# Patient Record
Sex: Female | Born: 1950 | Race: White | Hispanic: No | Marital: Married
Health system: Southern US, Community
[De-identification: ages and names within clinical notes are randomized; demographics above are authoritative.]

---

## 1998-06-06 ENCOUNTER — Other Ambulatory Visit: Admission: RE | Admit: 1998-06-06 | Discharge: 1998-06-06 | Payer: Self-pay | Admitting: Obstetrics and Gynecology

## 2000-09-25 ENCOUNTER — Other Ambulatory Visit: Admission: RE | Admit: 2000-09-25 | Discharge: 2000-09-25 | Payer: Self-pay | Admitting: Family Medicine

## 2005-01-24 ENCOUNTER — Ambulatory Visit: Payer: Self-pay | Admitting: Urology

## 2005-06-25 ENCOUNTER — Ambulatory Visit: Payer: Self-pay | Admitting: Family Medicine

## 2005-07-17 ENCOUNTER — Ambulatory Visit: Payer: Self-pay | Admitting: Family Medicine

## 2005-09-03 ENCOUNTER — Encounter: Payer: Self-pay | Admitting: Family Medicine

## 2005-09-03 ENCOUNTER — Ambulatory Visit: Payer: Self-pay | Admitting: Family Medicine

## 2005-09-03 ENCOUNTER — Other Ambulatory Visit: Admission: RE | Admit: 2005-09-03 | Discharge: 2005-09-03 | Payer: Self-pay | Admitting: Family Medicine

## 2005-09-03 LAB — CONVERTED CEMR LAB: Pap Smear: NORMAL

## 2005-09-18 ENCOUNTER — Ambulatory Visit: Payer: Self-pay | Admitting: Urology

## 2005-09-25 ENCOUNTER — Ambulatory Visit: Payer: Self-pay | Admitting: Urology

## 2005-10-28 ENCOUNTER — Ambulatory Visit: Payer: Self-pay | Admitting: Internal Medicine

## 2005-10-30 ENCOUNTER — Ambulatory Visit: Payer: Self-pay | Admitting: Family Medicine

## 2005-11-04 ENCOUNTER — Ambulatory Visit: Payer: Self-pay | Admitting: Family Medicine

## 2005-11-08 ENCOUNTER — Ambulatory Visit: Payer: Self-pay | Admitting: Internal Medicine

## 2005-11-21 ENCOUNTER — Ambulatory Visit: Payer: Self-pay | Admitting: Family Medicine

## 2006-07-18 ENCOUNTER — Ambulatory Visit: Payer: Self-pay | Admitting: Family Medicine

## 2006-07-18 LAB — CONVERTED CEMR LAB
ALT: 26 units/L (ref 0–40)
BUN: 26 mg/dL — ABNORMAL HIGH (ref 6–23)
CO2: 25 meq/L (ref 19–32)
Cholesterol: 160 mg/dL (ref 0–200)
Creatinine, Ser: 0.9 mg/dL (ref 0.4–1.2)
Glucose, Bld: 102 mg/dL — ABNORMAL HIGH (ref 70–99)
HDL: 37.8 mg/dL — ABNORMAL LOW (ref >39.0)
LDL Cholesterol: 97 mg/dL (ref 0–99)
Phosphorus: 3.3 mg/dL (ref 2.3–4.6)
Total CHOL/HDL Ratio: 4.2
Triglycerides: 124 mg/dL (ref 0–149)

## 2006-07-22 ENCOUNTER — Ambulatory Visit: Payer: Self-pay | Admitting: Family Medicine

## 2006-12-29 IMAGING — CR DG ABDOMEN 1V
1 series · 2 of 2 positions shown · non-contrast
Comparison: none

REASON FOR EXAM: Nephrolithiasis
COMMENTS:

[Series 1: view not recorded · 0.17mm/px · 2 of 2 slices shown]
[im 1/2]
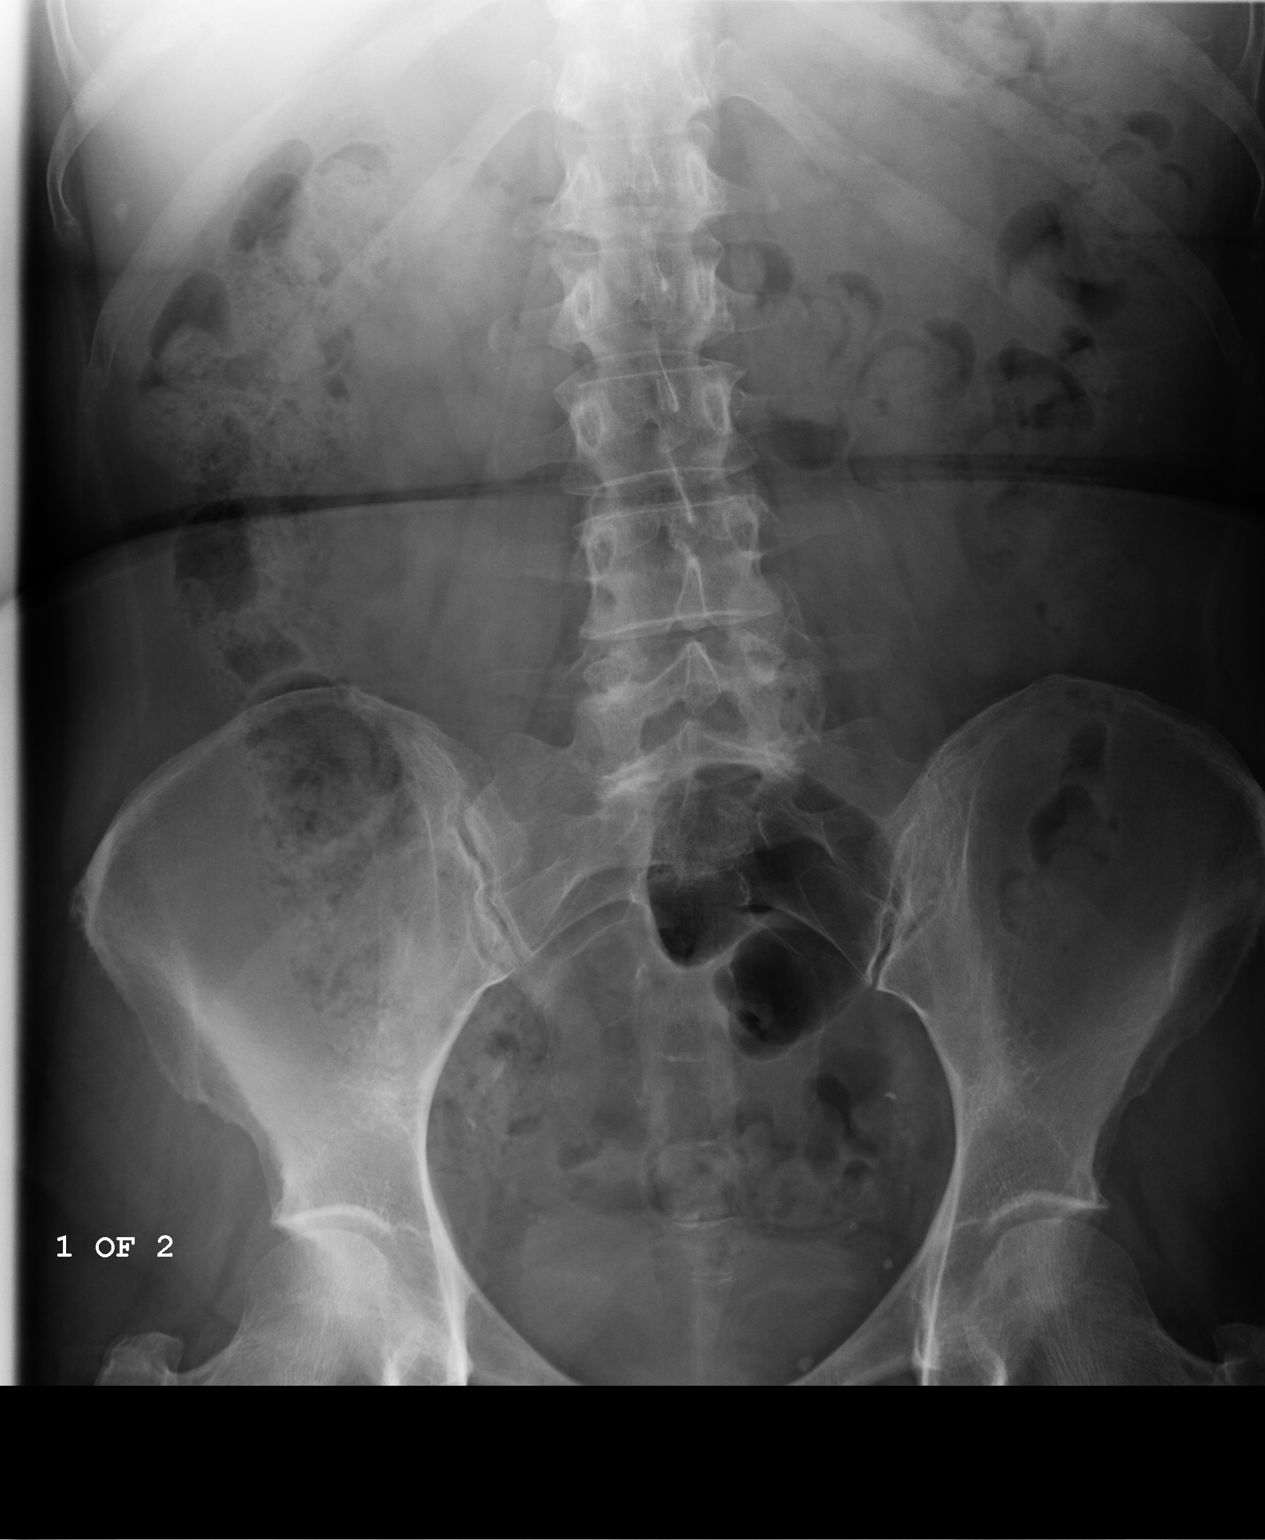
[im 2/2]
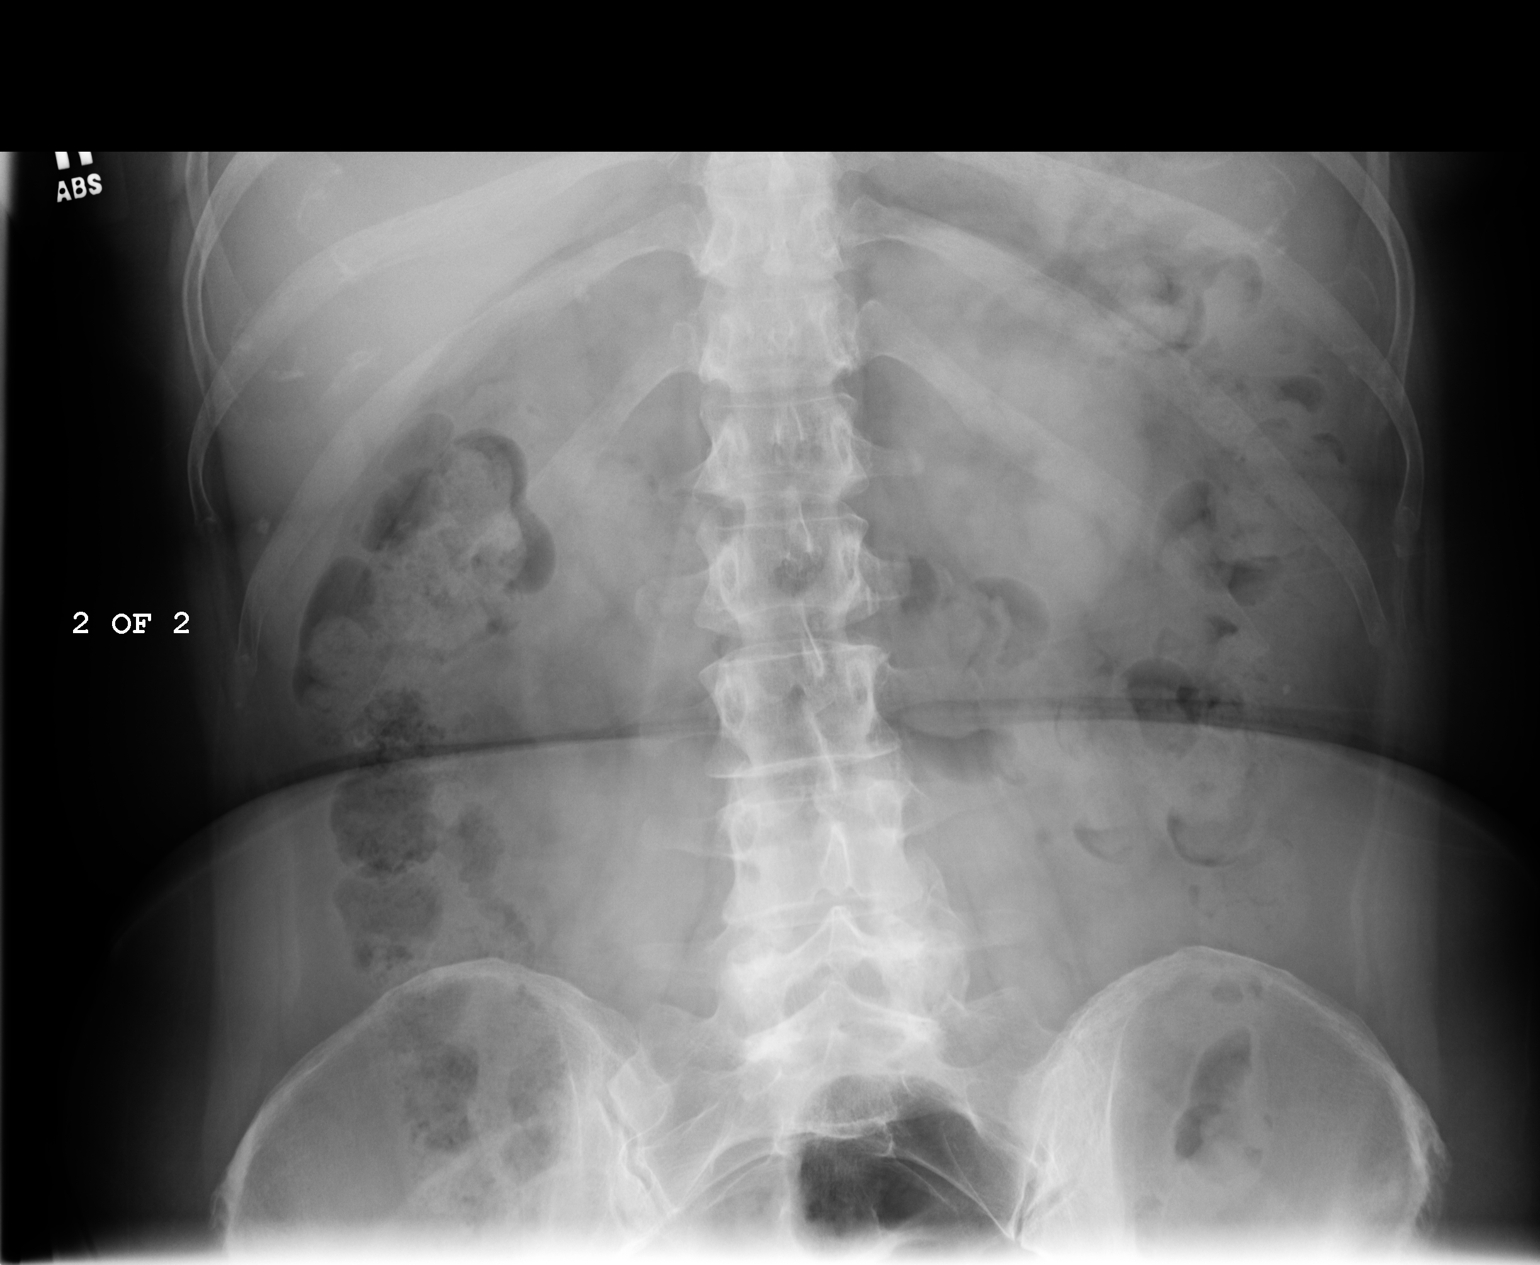

[2 of 2 positions shown; findings below may reference images not displayed]

PROCEDURE:     DXR - DXR KIDNEY URETER BLADDER  - September 25, 2005  [DATE]

RESULT:     AP view of the abdomen is compared to the prior exam of
01/24/2005. The previously noted calcific density projected over the L4
lumbar transverse process on the LEFT is no longer seen. No definite stone
fragments are identified along the course of the ureter. Phleboliths are
present bilaterally. No calcifications are seen on the RIGHT.
IMPRESSION: No definite renal or ureteral calcifications are identified
on plain film examination.

## 2007-01-22 ENCOUNTER — Ambulatory Visit: Payer: Self-pay | Admitting: Family Medicine

## 2007-01-27 LAB — CONVERTED CEMR LAB
HDL: 40.5 mg/dL (ref >39.0)
Total CHOL/HDL Ratio: 4.2
Triglycerides: 91 mg/dL (ref 0–149)
VLDL: 18 mg/dL (ref 0–40)

## 2007-08-14 ENCOUNTER — Encounter: Payer: Self-pay | Admitting: Family Medicine

## 2007-08-14 DIAGNOSIS — N6019 Diffuse cystic mastopathy of unspecified breast: Secondary | ICD-10-CM

## 2007-08-14 DIAGNOSIS — E785 Hyperlipidemia, unspecified: Secondary | ICD-10-CM

## 2007-08-14 DIAGNOSIS — I10 Essential (primary) hypertension: Secondary | ICD-10-CM | POA: Insufficient documentation

## 2007-08-17 ENCOUNTER — Ambulatory Visit: Payer: Self-pay | Admitting: Family Medicine

## 2007-08-17 DIAGNOSIS — R7309 Other abnormal glucose: Secondary | ICD-10-CM

## 2007-08-21 LAB — CONVERTED CEMR LAB
Albumin: 4 g/dL (ref 3.5–5.2)
BUN: 16 mg/dL (ref 6–23)
Calcium: 9.5 mg/dL (ref 8.4–10.5)
Cholesterol: 189 mg/dL (ref 0–200)
Glucose, Bld: 115 mg/dL — ABNORMAL HIGH (ref 70–99)
HDL: 40.6 mg/dL (ref >39.0)
LDL Cholesterol: 121 mg/dL — ABNORMAL HIGH (ref 0–99)
Sodium: 143 meq/L (ref 135–145)
VLDL: 27 mg/dL (ref 0–40)

## 2007-09-02 ENCOUNTER — Ambulatory Visit: Payer: Self-pay | Admitting: Family Medicine

## 2007-09-02 DIAGNOSIS — J309 Allergic rhinitis, unspecified: Secondary | ICD-10-CM | POA: Insufficient documentation

## 2008-03-17 ENCOUNTER — Encounter: Payer: Self-pay | Admitting: Family Medicine

## 2008-12-28 ENCOUNTER — Ambulatory Visit: Payer: Self-pay | Admitting: Family Medicine

## 2008-12-29 ENCOUNTER — Encounter (INDEPENDENT_AMBULATORY_CARE_PROVIDER_SITE_OTHER): Payer: Self-pay | Admitting: Internal Medicine

## 2008-12-29 LAB — CONVERTED CEMR LAB
BUN: 27 mg/dL — ABNORMAL HIGH (ref 6–23)
CO2: 30 meq/L (ref 19–32)
Calcium: 9.7 mg/dL (ref 8.4–10.5)
Cholesterol: 166 mg/dL (ref 0–200)
Creatinine, Ser: 0.8 mg/dL (ref 0.4–1.2)
Glucose, Bld: 108 mg/dL — ABNORMAL HIGH (ref 70–99)
LDL Cholesterol: 107 mg/dL — ABNORMAL HIGH (ref 0–99)
Triglycerides: 92 mg/dL (ref 0.0–149.0)
VLDL: 18.4 mg/dL (ref 0.0–40.0)

## 2009-04-25 ENCOUNTER — Encounter: Payer: Self-pay | Admitting: Family Medicine

## 2009-05-01 ENCOUNTER — Encounter (INDEPENDENT_AMBULATORY_CARE_PROVIDER_SITE_OTHER): Payer: Self-pay | Admitting: *Deleted

## 2010-04-26 ENCOUNTER — Encounter: Payer: Self-pay | Admitting: Family Medicine

## 2010-04-30 ENCOUNTER — Encounter: Payer: Self-pay | Admitting: Family Medicine

## 2010-04-30 ENCOUNTER — Encounter (INDEPENDENT_AMBULATORY_CARE_PROVIDER_SITE_OTHER): Payer: Self-pay | Admitting: *Deleted

## 2010-06-21 NOTE — Miscellaneous (Signed)
   Clinical Lists Changes  Observations: Added new observation of MAMMO DUE: 04/2011 (04/30/2010 13:49) Added new observation of MAMMOGRAM: normal (04/26/2010 13:49)      Preventive Care Screening  Mammogram:    Date:  04/26/2010    Next Due:  04/2011    Results:  normal

## 2010-06-21 NOTE — Letter (Signed)
Summary: Results Follow up Letter  [facility]  [address] [place], [place] [address]   Phone: [phone]  Fax: [phone]    04/30/2010 MRN: [id number]  Ebony Roy [address] [place], [place]  [address]  Dear Ebony Roy,  The following are the results of your recent test(s):  Test         Result    Pap Smear:        Normal _____  Not Normal _____ Comments: ______________________________________________________ Cholesterol: LDL(Bad cholesterol):         Your goal is less than:         HDL (Good cholesterol):       Your goal is more than: Comments:  ______________________________________________________ Mammogram:        Normal __X___  Not Normal _____ Comments:Repeat in 1 year  ___________________________________________________________________ Hemoccult:        Normal _____  Not normal _______ Comments:    _____________________________________________________________________ Other Tests:    We routinely do not discuss normal results over the telephone.  If you desire a copy of the results, or you have any questions about this information we can discuss them at your next office visit.   Sincerely,      Sharilyn Sites for Dr. Roxy Manns

## 2011-08-27 ENCOUNTER — Encounter: Payer: Self-pay | Admitting: Family Medicine

## 2011-08-28 ENCOUNTER — Encounter: Payer: Self-pay | Admitting: *Deleted

## 2012-04-29 ENCOUNTER — Ambulatory Visit: Payer: Self-pay | Admitting: Urology

## 2012-05-01 ENCOUNTER — Emergency Department: Payer: Self-pay | Admitting: Emergency Medicine

## 2012-05-01 LAB — URINALYSIS, COMPLETE
Leukocyte Esterase: NEGATIVE
Nitrite: NEGATIVE
Ph: 8 (ref 4.5–8.0)
Protein: 30
RBC,UR: 2227 /HPF (ref 0–5)
WBC UR: 3 /HPF (ref 0–5)

## 2012-05-01 LAB — COMPREHENSIVE METABOLIC PANEL
Alkaline Phosphatase: 93 U/L (ref 50–136)
BUN: 20 mg/dL — ABNORMAL HIGH (ref 7–18)
Bilirubin,Total: 0.5 mg/dL (ref 0.2–1.0)
Co2: 24 mmol/L (ref 21–32)
Creatinine: 0.98 mg/dL (ref 0.60–1.30)
EGFR (African American): >60
EGFR (Non-African Amer.): >60
Sodium: 139 mmol/L (ref 136–145)
Total Protein: 7.2 g/dL (ref 6.4–8.2)

## 2012-05-01 LAB — CBC WITH DIFFERENTIAL/PLATELET
Basophil #: 0 10*3/uL (ref 0.0–0.1)
Basophil %: 0.2 %
Eosinophil #: 0 10*3/uL (ref 0.0–0.7)
HGB: 13.8 g/dL (ref 12.0–16.0)
MCH: 28.1 pg (ref 26.0–34.0)
MCHC: 33.9 g/dL (ref 32.0–36.0)
Monocyte #: 0.5 x10 3/mm (ref 0.2–0.9)
Neutrophil %: 90.1 %
RDW: 13.4 % (ref 11.5–14.5)

## 2013-01-20 ENCOUNTER — Encounter: Payer: Self-pay | Admitting: Family Medicine

## 2013-01-22 ENCOUNTER — Encounter: Payer: Self-pay | Admitting: *Deleted

## 2013-08-02 IMAGING — CR DG ABDOMEN 1V
1 series · 2 of 2 positions shown · non-contrast
Comparison: none

REASON FOR EXAM: Nephrolithiasis
COMMENTS:

PROCEDURE:     DXR - DXR KIDNEY URETER BLADDER  - April 29, 2012 [DATE]
RESULT:

[Series 1: t abdomen supine · 0.14mm/px · 2 of 2 slices shown]
[im 1/2]
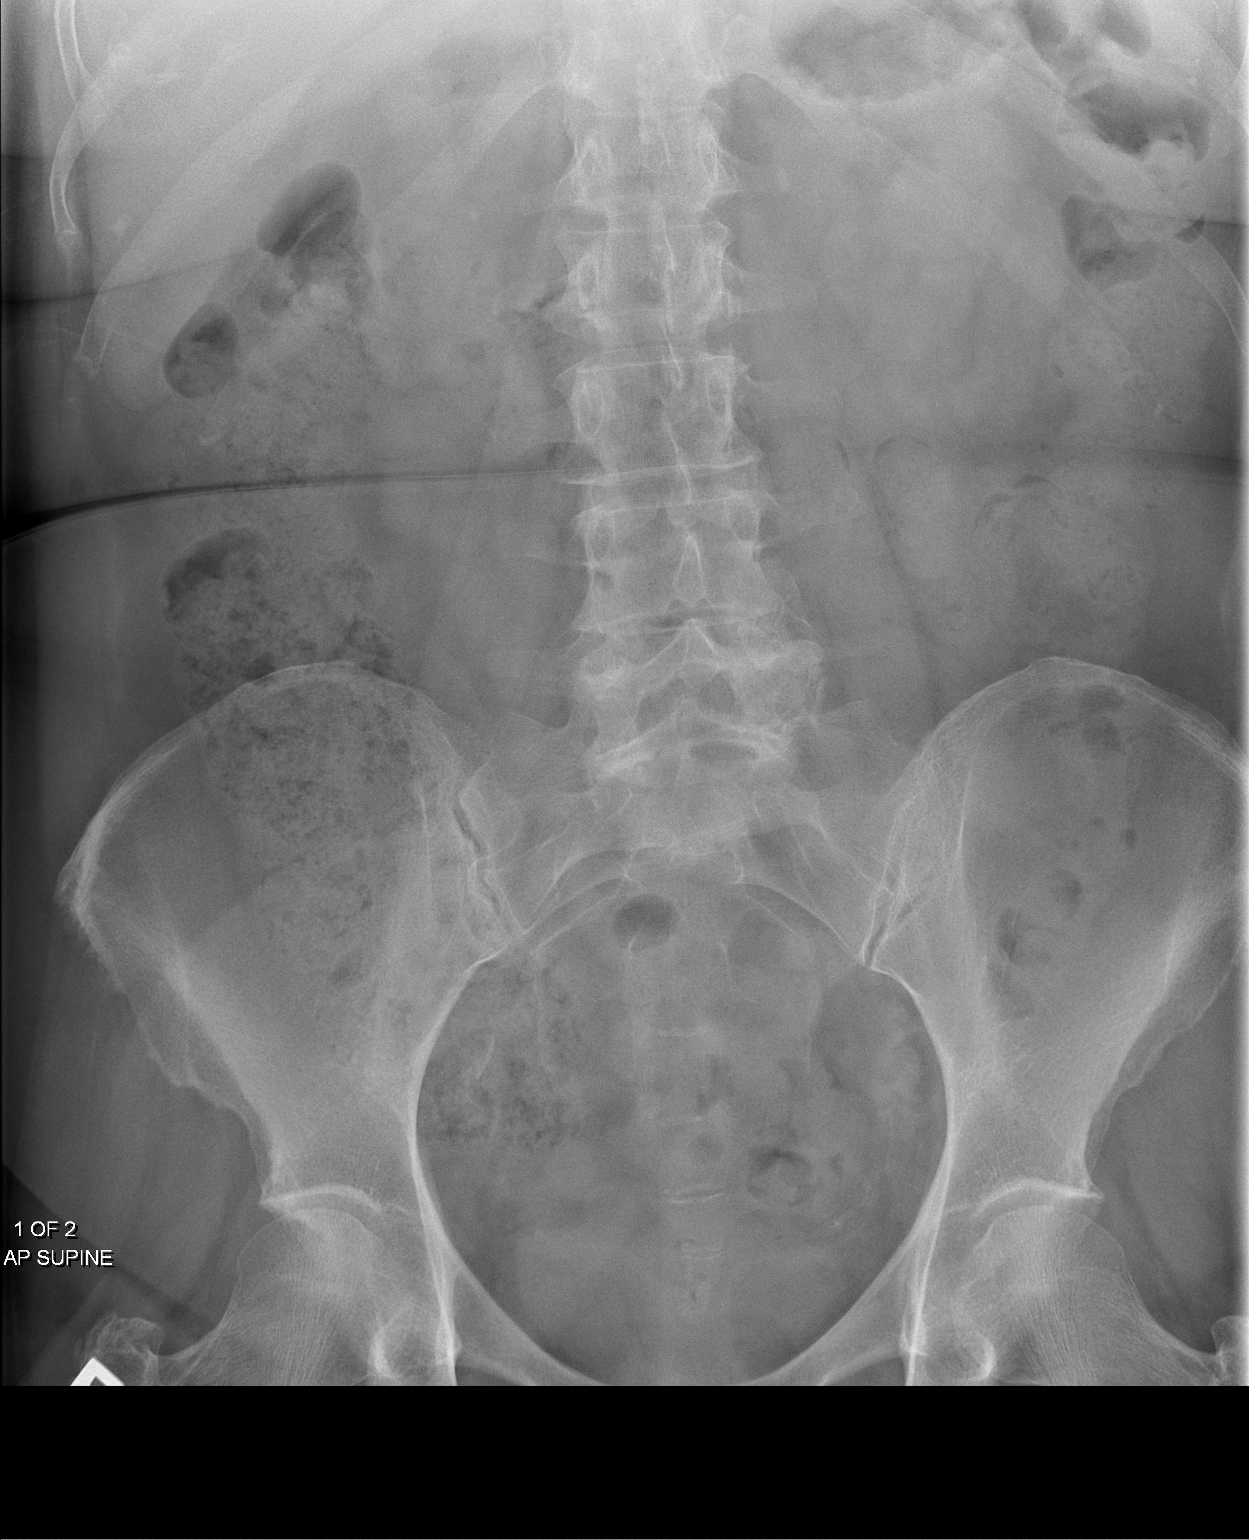
[im 2/2]
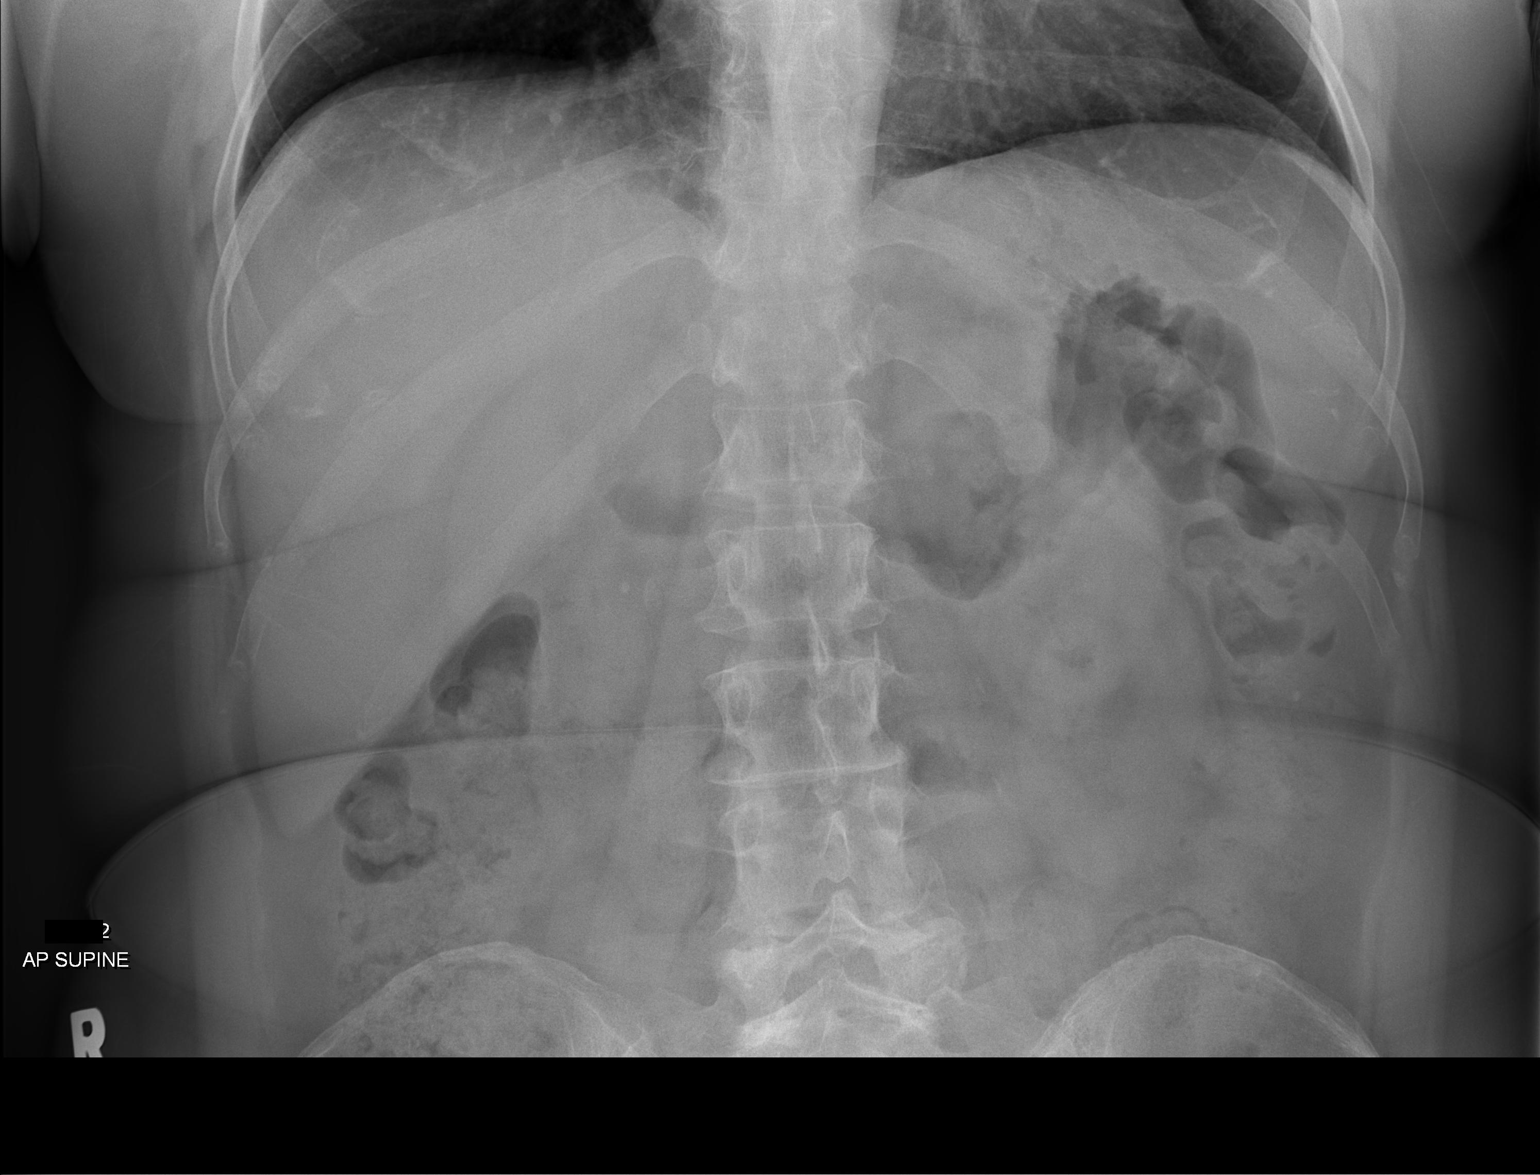

[2 of 2 positions shown; findings below may reference images not displayed]

FINDINGS: Air is seen within nondilated loops of large and small bowel.
Large amount of stool is appreciated within the colon. Vague calcified
density projects in the upper pole region of the right kidney and this may
represent density within stool. No further definitive calcified densities
are identified. The visualized bony skeleton is unremarkable.
IMPRESSION: 1.  Nonobstructive bowel gas pattern.
2.  There is a 2 to 3 mm, calcified density projecting in the upper to mid
pole region of the right kidney. This may represent a renal calculus.

## 2014-06-13 ENCOUNTER — Encounter: Payer: Self-pay | Admitting: Family Medicine

## 2014-06-14 ENCOUNTER — Encounter: Payer: Self-pay | Admitting: *Deleted

## 2015-12-12 ENCOUNTER — Encounter: Payer: Self-pay | Admitting: Internal Medicine

## 2021-02-02 ENCOUNTER — Ambulatory Visit: Payer: Self-pay | Admitting: Urology

## 2021-03-07 ENCOUNTER — Telehealth: Payer: Self-pay | Admitting: *Deleted

## 2021-03-07 NOTE — Telephone Encounter (Signed)
Opened in error, received a flu vaccine sheet naming Dr. Milinda Antis as PCP. DR. Tower no longer PCP due to not being seen in over 3 yrs
